# Patient Record
Sex: Male | Born: 1970 | Race: Black or African American | Hispanic: No | Marital: Married | Smoking: Never smoker
Health system: Southern US, Community
[De-identification: ages and names within clinical notes are randomized; demographics above are authoritative.]

## PROBLEM LIST (undated history)

## (undated) DIAGNOSIS — I10 Essential (primary) hypertension: Secondary | ICD-10-CM

## (undated) HISTORY — PX: HERNIA REPAIR: SHX51

---

## 1999-05-29 ENCOUNTER — Inpatient Hospital Stay (HOSPITAL_COMMUNITY): Admission: EM | Admit: 1999-05-29 | Discharge: 1999-05-31 | Payer: Self-pay | Admitting: Emergency Medicine

## 1999-05-29 ENCOUNTER — Encounter: Payer: Self-pay | Admitting: Emergency Medicine

## 2004-04-28 ENCOUNTER — Emergency Department (HOSPITAL_COMMUNITY): Admission: EM | Admit: 2004-04-28 | Discharge: 2004-04-28 | Payer: Self-pay | Admitting: Emergency Medicine

## 2004-10-01 ENCOUNTER — Emergency Department (HOSPITAL_COMMUNITY): Admission: EM | Admit: 2004-10-01 | Discharge: 2004-10-01 | Payer: Self-pay | Admitting: Emergency Medicine

## 2010-06-22 ENCOUNTER — Emergency Department (HOSPITAL_BASED_OUTPATIENT_CLINIC_OR_DEPARTMENT_OTHER)
Admission: EM | Admit: 2010-06-22 | Discharge: 2010-06-22 | Disposition: A | Discharge status: Home / Self Care | Payer: Self-pay | Attending: Emergency Medicine | Admitting: Emergency Medicine

## 2010-06-22 DIAGNOSIS — J329 Chronic sinusitis, unspecified: Secondary | ICD-10-CM | POA: Insufficient documentation

## 2010-06-22 DIAGNOSIS — I10 Essential (primary) hypertension: Secondary | ICD-10-CM | POA: Insufficient documentation

## 2010-06-22 DIAGNOSIS — R51 Headache: Secondary | ICD-10-CM | POA: Insufficient documentation

## 2011-02-17 ENCOUNTER — Emergency Department (HOSPITAL_COMMUNITY)
Admission: EM | Admit: 2011-02-17 | Discharge: 2011-02-17 | Disposition: A | Discharge status: Home / Self Care | Payer: 59 | Attending: Emergency Medicine | Admitting: Emergency Medicine

## 2011-02-17 ENCOUNTER — Encounter: Payer: Self-pay | Admitting: *Deleted

## 2011-02-17 ENCOUNTER — Emergency Department (HOSPITAL_COMMUNITY): Payer: 59

## 2011-02-17 DIAGNOSIS — R51 Headache: Secondary | ICD-10-CM | POA: Insufficient documentation

## 2011-02-17 DIAGNOSIS — R209 Unspecified disturbances of skin sensation: Secondary | ICD-10-CM | POA: Insufficient documentation

## 2011-02-17 DIAGNOSIS — I10 Essential (primary) hypertension: Secondary | ICD-10-CM

## 2011-02-17 HISTORY — DX: Essential (primary) hypertension: I10

## 2011-02-17 LAB — POCT I-STAT, CHEM 8
BUN: 9 mg/dL (ref 6–23)
Calcium, Ion: 1.24 mmol/L (ref 1.12–1.32)
Chloride: 104 mEq/L (ref 96–112)
Creatinine, Ser: 1.2 mg/dL (ref 0.50–1.35)
Glucose, Bld: 103 mg/dL — ABNORMAL HIGH (ref 70–99)
HCT: 51 % (ref 39.0–52.0)
Hemoglobin: 17.3 g/dL — ABNORMAL HIGH (ref 13.0–17.0)
Potassium: 4.2 mEq/L (ref 3.5–5.1)
Sodium: 143 mEq/L (ref 135–145)
TCO2: 31 mmol/L (ref 0–100)

## 2011-02-17 LAB — CBC
HCT: 46.6 % (ref 39.0–52.0)
Hemoglobin: 15.3 g/dL (ref 13.0–17.0)
MCH: 25.1 pg — ABNORMAL LOW (ref 26.0–34.0)
MCHC: 32.8 g/dL (ref 30.0–36.0)
MCV: 76.5 fL — ABNORMAL LOW (ref 78.0–100.0)
Platelets: 202 10*3/uL (ref 150–400)
RBC: 6.09 MIL/uL — ABNORMAL HIGH (ref 4.22–5.81)
RDW: 14.8 % (ref 11.5–15.5)
WBC: 7.4 10*3/uL (ref 4.0–10.5)

## 2011-02-17 MED ORDER — HYDROCHLOROTHIAZIDE 25 MG PO TABS: 25.0000 mg | ORAL_TABLET | Freq: Every day | ORAL | Status: AC

## 2011-02-17 NOTE — ED Notes (Signed)
Patient denies pain and is resting comfortably.  

## 2011-02-17 NOTE — ED Provider Notes (Signed)
Medical screening examination/treatment/procedure(s) were performed by non-physician practitioner and as supervising physician I was immediately available for consultation/collaboration.  Ethelda Chick, MD 02/17/11 [phone]

## 2011-02-17 NOTE — ED Provider Notes (Signed)
History     CSN: [id number]  Arrival date & time 02/17/11  0028   First MD Initiated Contact with Patient 02/17/11 [phone]      Chief Complaint  Patient presents with  . Headache    (Consider location/radiation/quality/duration/timing/severity/associated sxs/prior treatment) HPI Comments: HA x 1 week; high blood pressure x 6-8 mo, not controlled.  HA worse when lying flat.  Denies vision change, double vision, blurred vision, chest pain ,SOB, DOE, or syncope.   No hx of blood clots, high cholesterol, CAD, DM, and not currently taking any medications.   No PCP- requests resource guide at dc for follow up   Patient is a 40 y.o. male presenting with headaches. The history is provided by the patient.  Headache  The current episode started more than 1 week ago. Episode frequency: at night  The problem has not changed since onset.The headache is associated with nothing. The pain is located in the bilateral region. Quality: pressure. The pain is at a severity of 5/10. The pain is mild. The pain does not radiate. Pertinent negatives include no anorexia, no fever, no malaise/fatigue, no chest pressure, no near-syncope, no orthopnea, no palpitations, no syncope, no shortness of breath, no nausea and no vomiting. He has tried NSAIDs for the symptoms. The treatment provided no relief.    Past Medical History  Diagnosis Date  . Hypertension     History reviewed. No pertinent past surgical history.  History reviewed. No pertinent family history.  History  Substance Use Topics  . Smoking status: Not on file  . Smokeless tobacco: Not on file  . Alcohol Use:       Review of Systems  Constitutional: Negative for fever and malaise/fatigue.  HENT: Negative for ear pain, congestion, facial swelling, rhinorrhea, neck pain and neck stiffness.   Eyes: Negative for photophobia, pain and visual disturbance.  Respiratory: Negative for cough, chest tightness, shortness of breath and stridor.     Cardiovascular: Negative for chest pain, palpitations, orthopnea, leg swelling, syncope and near-syncope.  Gastrointestinal: Negative for nausea, vomiting, constipation and anorexia.  Genitourinary: Negative for difficulty urinating.  Musculoskeletal: Negative for back pain and gait problem.  Skin: Negative for rash.  Neurological: Positive for headaches. Negative for dizziness, seizures, facial asymmetry, speech difficulty, weakness, light-headedness and numbness.  Psychiatric/Behavioral: Negative for confusion.  All other systems reviewed and are negative.    Allergies  Review of patient's allergies indicates no known allergies.  Home Medications  No current outpatient prescriptions on file.  BP 158/109  Pulse 89  Temp(Src) 98.4 F (36.9 C) (Oral)  Resp 17  SpO2 98%  Physical Exam  Nursing note and vitals reviewed. Constitutional: He is oriented to person, place, and time. He appears well-developed and well-nourished. No distress.  HENT:  Head: Normocephalic and atraumatic.  Eyes: Conjunctivae and EOM are normal. Pupils are equal, round, and reactive to light. Right conjunctiva has no hemorrhage. Left conjunctiva has no hemorrhage. Right eye exhibits normal extraocular motion and no nystagmus. Left eye exhibits normal extraocular motion and no nystagmus.  Fundoscopic exam:      The right eye shows no papilledema.       The left eye shows no papilledema.       Normal appearance  Neck: Normal range of motion.  Cardiovascular: Normal rate, regular rhythm, normal heart sounds and intact distal pulses.   Pulmonary/Chest: Effort normal and breath sounds normal. No respiratory distress. He has no wheezes. He has no rales. He exhibits no tenderness.  Abdominal: Soft. There is no tenderness.  Musculoskeletal: Normal range of motion.  Neurological: He is alert and oriented to person, place, and time. He has normal strength and normal reflexes. He displays no tremor. No cranial nerve  deficit or sensory deficit (Positive past pointing: unable to sense right thigh, pt believes toughing left thigh instead). He displays a negative Romberg sign. Coordination and gait normal. GCS eye subscore is 4. GCS verbal subscore is 5. GCS motor subscore is 6.       5/5 and equal upper and lower extremity strength.  No pronator drift.  No nystagmus. Finger to nose & shin to heal intact. Able to do rapid alternating movements.  Skin: Skin is warm and dry. No rash noted.  Psychiatric: He has a normal mood and affect. His speech is normal and behavior is normal.    ED Course  Procedures (including critical care time)  Labs Reviewed  CBC - Abnormal; Notable for the following:    RBC 6.09 (*)    MCV 76.5 (*)    MCH 25.1 (*)    All other components within normal limits  POCT I-STAT, CHEM 8 - Abnormal; Notable for the following:    Glucose, Bld 103 (*)    Hemoglobin 17.3 (*)    All other components within normal limits  I-STAT, CHEM 8   Ct Head Wo Contrast  02/17/2011  *RADIOLOGY REPORT*  Clinical Data: Headache.  CT HEAD WITHOUT CONTRAST  Technique:  Contiguous axial images were obtained from the base of the skull through the vertex without contrast.  Comparison: None  Findings: No acute intracranial abnormality.  Specifically, no hemorrhage, hydrocephalus, mass lesion, acute infarction, or significant intracranial injury.  No acute calvarial abnormality. Visualized paranasal sinuses and mastoids clear.  Orbital soft tissues unremarkable.  IMPRESSION: Normal study  Original Report Authenticated By: Cyndie Chime, M.D.   Discuss possibility that patient's hypertension has caused him to have a new onset of headache.  CT results indicate no mass shift or hydrocephalus that could be causing increased cranial pressure.  Neurological physical exam showed no deficits and patient is sitting comfortably in the emergency department with no complaints of current headache.  He is in no acute distress  vital signs are stable and patient was discharged with hydrochlorothiazide 25 mg and recommendations to followup with a primary care doctor.  The patient has been discussed with Dr. Karma Ganja who agrees with my plan to discharge patient.  No diagnosis found.  Resource guide   MDM  Hypertension HA        [place], [place] 02/17/11 [phone]

## 2011-02-17 NOTE — ED Notes (Signed)
Pt states that he has had a headache x 1 week that concentrates mainly at the top of his head and tends to occur most when he is preparing to go to bed.  The pain is described as tightening and is rated @ 8/10.  Pt denies hx of same.

## 2011-02-17 NOTE — ED Notes (Signed)
Vital signs stable. 

## 2011-02-17 NOTE — ED Notes (Signed)
Patient is resting comfortably. 

## 2013-01-11 ENCOUNTER — Emergency Department (INDEPENDENT_AMBULATORY_CARE_PROVIDER_SITE_OTHER)
Admission: EM | Admit: 2013-01-11 | Discharge: 2013-01-11 | Disposition: A | Discharge status: Home / Self Care | Payer: PRIVATE HEALTH INSURANCE | Source: Home / Self Care | Attending: Emergency Medicine | Admitting: Emergency Medicine

## 2013-01-11 ENCOUNTER — Encounter (HOSPITAL_COMMUNITY): Payer: Self-pay | Admitting: Emergency Medicine

## 2013-01-11 DIAGNOSIS — L02212 Cutaneous abscess of back [any part, except buttock]: Secondary | ICD-10-CM

## 2013-01-11 DIAGNOSIS — L02219 Cutaneous abscess of trunk, unspecified: Secondary | ICD-10-CM

## 2013-01-11 MED ORDER — SULFAMETHOXAZOLE-TMP DS 800-160 MG PO TABS: 2.0000 | ORAL_TABLET | Freq: Two times a day (BID) | ORAL | Status: DC

## 2013-01-11 MED ORDER — HYDROCODONE-ACETAMINOPHEN 5-325 MG PO TABS: ORAL_TABLET | ORAL | Status: DC

## 2013-01-11 MED ORDER — AMLODIPINE BESYLATE 5 MG PO TABS: 5.0000 mg | ORAL_TABLET | Freq: Every day | ORAL | Status: DC

## 2013-01-11 NOTE — ED Notes (Signed)
C/o abscess in center of upper back on Sunday.  Puts a bandaid on everyday and pulls it off to make it drain.  Started draining Sunday.

## 2013-01-11 NOTE — ED Provider Notes (Signed)
Chief Complaint:   Chief Complaint  Patient presents with  . Abscess    History of Present Illness:    Zachary Gibbs is a 42 year old male who has had a three-day history of a painful boil on his upper, mid back and this is been draining some blood and pus. He denies any fever or chills. He denies any prior history of abscesses, pulse, or skin infections. He did not think he had any kind of cyst in this area.  Review of Systems:  Other than noted above, the patient denies any of the following symptoms: Systemic:  No fever, chills or sweats. Skin:  No rash or itching.  PMFSH:  Past medical history, family history, social history, meds, and allergies were reviewed.  No history of diabetes or prior history of abscesses or MRSA. He is supposed to be on high blood pressure medication but is not taking it right now. He tried hydrochlorothiazide, but it caused photosensitivity.  Physical Exam:   Vital signs:  BP 164/105  Pulse 90  Temp(Src) 98.1 F (36.7 C) (Oral)  Resp 16  SpO2 100% Skin:  There is a 2.5 x 2 point 0.5 cm tender, raised, red, tender abscess in the upper, mid back which is draining copious yellow pus.  Skin exam was otherwise normal.  No rash. Ext:  Distal pulses were full, patient has full ROM of all joints.  Procedure:  Verbal informed consent was obtained.  The patient was informed of the risks and benefits of the procedure and understands and accepts.  Identity of the patient was verified verbally and by wristband.   The abscess area described above was prepped with Betadine and alcohol and anesthetized with 3 mL of 2% Xylocaine with epinephrine.  Using a #11 scalpel blade, a singe straight incision was made into the area of fluctulence, yielding a large amount of prurulent drainage and squamous debris.  Routine cultures were obtained.  Blunt dissection was used to break up loculations and the resulting wound cavity was packed with 1/4 inch Iodoform gauze.  A sterile  pressure dressing was applied.  Assessment:  The encounter diagnosis was Abscess of back.  Probably due to infected sebaceous cyst. He needs to get back on a blood pressure medication, but hydrochlorothiazide appears to have caused a photosensitivity reaction. Will treat with amlodipine instead, and he was urged to get in to see a primary care physician as soon as possible.  Plan:   1.  Meds:  The following meds were prescribed:   Discharge Medication List as of 01/11/2013  8:19 PM    START taking these medications   Details  amLODipine (NORVASC) 5 MG tablet Take 1 tablet (5 mg total) by mouth daily., Starting 01/11/2013, Until Discontinued, Normal    HYDROcodone-acetaminophen (NORCO/VICODIN) 5-325 MG per tablet 1 to 2 tabs every 4 to 6 hours as needed for pain., Print    sulfamethoxazole-trimethoprim (BACTRIM DS) 800-160 MG per tablet Take 2 tablets by mouth 2 (two) times daily., Starting 01/11/2013, Until Discontinued, Normal        2.  Patient Education/Counseling:  The patient was given appropriate handouts, self care instructions, and instructed in symptomatic relief.   3.  Follow up:  The patient was instructed to leave the dressing in place and return here again in 48 hours for packing removal, if becoming worse in any way, and given some red flag symptoms such as fever which would prompt immediate return.      Reuben Likes,  MD 01/11/13 2042

## 2013-01-13 ENCOUNTER — Emergency Department (INDEPENDENT_AMBULATORY_CARE_PROVIDER_SITE_OTHER)
Admission: EM | Admit: 2013-01-13 | Discharge: 2013-01-13 | Disposition: A | Discharge status: Home / Self Care | Payer: PRIVATE HEALTH INSURANCE | Source: Home / Self Care

## 2013-01-13 ENCOUNTER — Encounter (HOSPITAL_COMMUNITY): Payer: Self-pay | Admitting: Emergency Medicine

## 2013-01-13 DIAGNOSIS — I10 Essential (primary) hypertension: Secondary | ICD-10-CM

## 2013-01-13 DIAGNOSIS — Z5189 Encounter for other specified aftercare: Secondary | ICD-10-CM

## 2013-01-13 DIAGNOSIS — L720 Epidermal cyst: Secondary | ICD-10-CM

## 2013-01-13 DIAGNOSIS — Z4800 Encounter for change or removal of nonsurgical wound dressing: Secondary | ICD-10-CM

## 2013-01-13 NOTE — ED Notes (Signed)
Here for recheck of skin infection

## 2013-01-13 NOTE — ED Notes (Signed)
Dry sterile dressing applied to wound, pt instructed regarding changing

## 2013-01-13 NOTE — ED Provider Notes (Signed)
CSN: [id number]     Arrival date & time 01/13/13  4098 History   First MD Initiated Contact with Patient 01/13/13 0940     Chief Complaint  Patient presents with  . Wound Check   (Consider location/radiation/quality/duration/timing/severity/associated sxs/prior Treatment) HPI Comments: For wound check S/P I and D of infected cyst on mid upper back. St feels better and no pain.   Past Medical History  Diagnosis Date  . Hypertension    History reviewed. No pertinent past surgical history. History reviewed. No pertinent family history. History  Substance Use Topics  . Smoking status: Never Smoker   . Smokeless tobacco: Not on file  . Alcohol Use: No    Review of Systems  All other systems reviewed and are negative.    Allergies  Review of patient's allergies indicates no known allergies.  Home Medications   Current Outpatient Rx  Name  Route  Sig  Dispense  Refill  . amLODipine (NORVASC) 5 MG tablet   Oral   Take 1 tablet (5 mg total) by mouth daily.   30 tablet   2   . EXPIRED: hydrochlorothiazide (HYDRODIURIL) 25 MG tablet   Oral   Take 1 tablet (25 mg total) by mouth daily.   30 tablet   0   . HYDROcodone-acetaminophen (NORCO/VICODIN) 5-325 MG per tablet      1 to 2 tabs every 4 to 6 hours as needed for pain.   20 tablet   0   . sulfamethoxazole-trimethoprim (BACTRIM DS) 800-160 MG per tablet   Oral   Take 2 tablets by mouth 2 (two) times daily.   40 tablet   0    BP 182/111  Pulse 106  Temp(Src) 98.8 F (37.1 C) (Oral)  SpO2 98% Physical Exam  Constitutional: He is oriented to person, place, and time.  Neurological: He is alert and oriented to person, place, and time. He exhibits normal muscle tone.  Skin: Skin is warm and dry.  Packing removed and the wound was manually compressed producing an expression of approx 20 cc of thick, cheezy exudate. No purulent material seen. No additional erythema.     ED Course  Wound repair Date/Time:  01/13/2013 10:06 AM Performed by: Phineas Real, Shanti Eichel Authorized by: Clementeen Graham, S Consent: Verbal consent obtained. Risks and benefits: risks, benefits and alternatives were discussed Consent given by: patient Patient understanding: patient states understanding of the procedure being performed Patient identity confirmed: verbally with patient Local anesthesia used: no Patient sedated: no Patient tolerance: Patient tolerated the procedure well with no immediate complications. Comments: Packing removed , contents expressed and dressing applied.    (including critical care time) Labs Review Labs Reviewed - No data to display Imaging Review No results found.    MDM   1. Encounter for wound re-check   2. Inclusion cyst   3. HTN (hypertension)      May shower but must express the water from the wound each time as directed. Keep covered for next few days until it starts to close and no further bleeding or draining. Cont the ABX.     Hayden Rasmussen, NP 01/13/13 1011

## 2013-01-15 LAB — CULTURE, ROUTINE-ABSCESS: Special Requests: NORMAL

## 2013-01-15 NOTE — ED Provider Notes (Signed)
Medical screening examination/treatment/procedure(s) were performed by a resident physician or non-physician practitioner and as the supervising physician I was immediately available for consultation/collaboration.  Raymar Joiner, MD    Bethanee Redondo S Monterius Rolf, MD 01/15/13 0842 

## 2016-03-26 ENCOUNTER — Encounter (HOSPITAL_BASED_OUTPATIENT_CLINIC_OR_DEPARTMENT_OTHER): Payer: Self-pay | Admitting: *Deleted

## 2016-03-26 ENCOUNTER — Emergency Department (HOSPITAL_BASED_OUTPATIENT_CLINIC_OR_DEPARTMENT_OTHER): Payer: 59

## 2016-03-26 ENCOUNTER — Emergency Department (HOSPITAL_BASED_OUTPATIENT_CLINIC_OR_DEPARTMENT_OTHER)
Admission: EM | Admit: 2016-03-26 | Discharge: 2016-03-26 | Disposition: A | Discharge status: Home / Self Care | Payer: 59 | Attending: Emergency Medicine | Admitting: Emergency Medicine

## 2016-03-26 DIAGNOSIS — Z79899 Other long term (current) drug therapy: Secondary | ICD-10-CM | POA: Insufficient documentation

## 2016-03-26 DIAGNOSIS — M19011 Primary osteoarthritis, right shoulder: Secondary | ICD-10-CM | POA: Diagnosis not present

## 2016-03-26 DIAGNOSIS — I10 Essential (primary) hypertension: Secondary | ICD-10-CM | POA: Insufficient documentation

## 2016-03-26 DIAGNOSIS — M25511 Pain in right shoulder: Secondary | ICD-10-CM

## 2016-03-26 DIAGNOSIS — Z7982 Long term (current) use of aspirin: Secondary | ICD-10-CM | POA: Insufficient documentation

## 2016-03-26 MED ORDER — ACETAMINOPHEN 500 MG PO TABS
1000.0000 mg | ORAL_TABLET | Freq: Once | ORAL | Status: AC
  Administered 2016-03-26: 1000 mg via ORAL
  Filled 2016-03-26: qty 2

## 2016-03-26 MED ORDER — METHOCARBAMOL 500 MG PO TABS: 1000.0000 mg | ORAL_TABLET | Freq: Three times a day (TID) | ORAL | 0 refills | Status: DC | PRN

## 2016-03-26 MED ORDER — IBUPROFEN 400 MG PO TABS
400.0000 mg | ORAL_TABLET | Freq: Once | ORAL | Status: AC
  Administered 2016-03-26: 400 mg via ORAL
  Filled 2016-03-26: qty 1

## 2016-03-26 MED FILL — METHOCARBAMOL 500 MG TABLET: 500 | 3 days supply | Qty: 20 | Fill #0

## 2016-03-26 NOTE — ED Triage Notes (Signed)
Reports right shoulder pain since Sat. Denies specific injury. Using ibuprofen and biofreeze with minimal relief.

## 2016-03-26 NOTE — ED Provider Notes (Addendum)
[facility]   CSN: [id number] Arrival date & time: 03/26/16  4098     History   Chief Complaint Chief Complaint  Patient presents with  . Shoulder Pain    HPI Zachary Gibbs is a 46 y.o. male.  Patient c/o right shoulder pain for the past few days. States work involves some lifting of boxes, but denies specific injury or strain. Pain is constant, dull, moderate. Worse w movement of shoulder. No hx chronic shoulder pain. Is right hand dom. No radicular pain. No numbness/weakness. No neck pain or stiffness. No fevers.    The history is provided by the patient.  Shoulder Pain   Pertinent negatives include no numbness.    Past Medical History:  Diagnosis Date  . Hypertension     There are no active problems to display for this patient.   Past Surgical History:  Procedure Laterality Date  . HERNIA REPAIR         Home Medications    Prior to Admission medications   Medication Sig Start Date End Date Taking? Authorizing Provider  amLODipine (NORVASC) 5 MG tablet Take 1 tablet (5 mg total) by mouth daily. 01/11/13  Yes Reuben Likes, MD  aspirin 81 MG chewable tablet Chew by mouth daily.   Yes Historical Provider, MD  hydrochlorothiazide (HYDRODIURIL) 25 MG tablet Take 1 tablet (25 mg total) by mouth daily. 02/17/11 02/17/12  Lisette Paz, PA-C  HYDROcodone-acetaminophen (NORCO/VICODIN) 5-325 MG per tablet 1 to 2 tabs every 4 to 6 hours as needed for pain. 01/11/13   Reuben Likes, MD  sulfamethoxazole-trimethoprim (BACTRIM DS) 800-160 MG per tablet Take 2 tablets by mouth 2 (two) times daily. 01/11/13   Reuben Likes, MD    Family History No family history on file.  Social History Social History  Substance Use Topics  . Smoking status: Never Smoker  . Smokeless tobacco: Never Used  . Alcohol use No     Allergies   Patient has no known allergies.   Review of Systems Review of Systems  Constitutional: Negative for fever.    Musculoskeletal: Negative for neck pain.  Skin: Negative for rash.  Neurological: Negative for weakness and numbness.     Physical Exam Updated Vital Signs BP (!) 178/113 (BP Location: Left Arm)   Pulse 93   Temp 97.7 F (36.5 C) (Oral)   Resp 18   Ht 5\' 11"  (1.803 m)   Wt 129.7 kg   SpO2 99%   BMI 39.89 kg/m   Physical Exam  Constitutional: He appears well-developed and well-nourished. No distress.  HENT:  Head: Atraumatic.  Eyes: Conjunctivae are normal.  Neck: Normal range of motion. Neck supple. No tracheal deviation present.  Cardiovascular: Normal rate and intact distal pulses.   Pulmonary/Chest: Effort normal. No accessory muscle usage. No respiratory distress.  Abdominal: He exhibits distension.  Musculoskeletal: He exhibits no edema.  Tenderness right shoulder. No gross sts noted. No skin changes or erythema. No pain w passive rom. Pain w full extension, abduction. Radial pulse 2+. No arm swelling.   Neurological: He is alert.  RUE rad/med/uln fxn, motor 5/5, sens grossly intact.   Skin: Skin is warm and dry. No rash noted.  Psychiatric: He has a normal mood and affect.  Nursing note and vitals reviewed.    ED Treatments / Results  Labs (all labs ordered are listed, but only abnormal results are displayed) Labs Reviewed - No data to display  EKG  EKG  Interpretation None       Radiology Dg Shoulder Right  Result Date: 03/26/2016 CLINICAL DATA:  Right shoulder pain for 4 days EXAM: RIGHT SHOULDER - 2+ VIEW COMPARISON:  None. FINDINGS: Early spurring at the rotator cuff insertion on the greater tuberosity. No acute bony abnormality. Specifically, no fracture, subluxation, or dislocation. Soft tissues are intact. IMPRESSION: Early degenerative changes.  No acute bony abnormality. Electronically Signed   By: Charlett NoseKevin  Dover M.D.   On: 03/26/2016 09:13    Procedures Procedures (including critical care time)  Medications Ordered in ED Medications   ibuprofen (ADVIL,MOTRIN) tablet 400 mg (not administered)  acetaminophen (TYLENOL) tablet 1,000 mg (not administered)     Initial Impression / Assessment and Plan / ED Course  I have reviewed the triage vital signs and the nursing notes.  Pertinent labs & imaging results that were available during my care of the patient were reviewed by me and considered in my medical decision making (see chart for details).  Imaging.   Tylenol and motrin po.  Discussed diff dx incl rotator cuff strain or injury, DDD,  labrum injury, ddd, other shoulder musculoskeletal pain.  For bp, pt states has adequate meds at home, but hasnt taken yet today.  No headache. No cp. No sob. No edema.   Patient appear stable for d/c.   Will have f/u pcp re bp.   Pt requests work note.   Final Clinical Impressions(s) / ED Diagnoses   Final diagnoses:  None    New Prescriptions New Prescriptions   No medications on file        Cathren LaineKevin Wynonna Fitzhenry, MD 03/26/16 [phone]

## 2016-03-26 NOTE — Discharge Instructions (Addendum)
It was our pleasure to provide your ER care today - we hope that you feel better.  Take motrin or aleve as need for pain.  You may also try robaxin as need for muscle spasm/pain.  You may try heat and/or gentle massage of area.  Follow up with sports medicine doctor or orthopedist in 1-2 weeks if symptoms fail to improve/resolve - see referral - call office to arrange appointment.  Your blood pressure is high - continue home meds, see high blood pressure information attached, and follow up with your primary care doctor for recheck in 1 week.   Return to ER if worse, intractable pain, numbness/weakness, fevers, other concern.

## 2017-06-13 ENCOUNTER — Encounter (HOSPITAL_BASED_OUTPATIENT_CLINIC_OR_DEPARTMENT_OTHER): Payer: Self-pay | Admitting: Emergency Medicine

## 2017-06-13 ENCOUNTER — Other Ambulatory Visit: Payer: Self-pay

## 2017-06-13 ENCOUNTER — Emergency Department (HOSPITAL_BASED_OUTPATIENT_CLINIC_OR_DEPARTMENT_OTHER)
Admission: EM | Admit: 2017-06-13 | Discharge: 2017-06-13 | Disposition: A | Discharge status: Home / Self Care | Payer: 59 | Attending: Emergency Medicine | Admitting: Emergency Medicine

## 2017-06-13 DIAGNOSIS — H5711 Ocular pain, right eye: Secondary | ICD-10-CM | POA: Insufficient documentation

## 2017-06-13 DIAGNOSIS — Z79899 Other long term (current) drug therapy: Secondary | ICD-10-CM | POA: Insufficient documentation

## 2017-06-13 DIAGNOSIS — Z7982 Long term (current) use of aspirin: Secondary | ICD-10-CM | POA: Diagnosis not present

## 2017-06-13 DIAGNOSIS — I10 Essential (primary) hypertension: Secondary | ICD-10-CM | POA: Insufficient documentation

## 2017-06-13 MED ORDER — CIPROFLOXACIN HCL 0.3 % OP SOLN: 2.0000 [drp] | Freq: Four times a day (QID) | OPHTHALMIC | 0 refills | Status: AC

## 2017-06-13 MED ORDER — FLUORESCEIN SODIUM 1 MG OP STRP
ORAL_STRIP | OPHTHALMIC | Status: AC
  Administered 2017-06-13: 1 via OPHTHALMIC
  Filled 2017-06-13: qty 1

## 2017-06-13 MED ORDER — TETRACAINE HCL 0.5 % OP SOLN
2.0000 [drp] | Freq: Once | OPHTHALMIC | Status: AC
  Administered 2017-06-13: 2 [drp] via OPHTHALMIC

## 2017-06-13 MED ORDER — FLUORESCEIN SODIUM 1 MG OP STRP
1.0000 | ORAL_STRIP | Freq: Once | OPHTHALMIC | Status: AC
  Administered 2017-06-13: 1 via OPHTHALMIC

## 2017-06-13 MED ORDER — TETRACAINE HCL 0.5 % OP SOLN
OPHTHALMIC | Status: AC
  Administered 2017-06-13: 2 [drp] via OPHTHALMIC
  Filled 2017-06-13: qty 4

## 2017-06-13 NOTE — ED Triage Notes (Signed)
Eye pain when looking at lights to OD, denies recent recent injury, no drainage but sclera red

## 2017-06-13 NOTE — Discharge Instructions (Addendum)
Use ciprofloxacin drops every 6 hours.  You can take ibuprofen and Tylenol as prescribed over-the-counter, as needed for your pain.  Please follow-up with the ophthalmologist within 2 days.  Please return to the emergency department if you develop any new or worsening symptoms including vision changes, or any other worsening of symptoms.

## 2017-06-13 NOTE — ED Provider Notes (Signed)
[facility] EMERGENCY DEPARTMENT Provider Note   CSN: [id number] Arrival date & time: 06/13/17  1022     History   Chief Complaint Chief Complaint  Patient presents with  . Eye Pain    HPI Zachary Gibbs is a 47 y.o. male with history of hypertension who presents with a less than 1 day history of right eye irritation and redness.  Patient has had associated photophobia.  He reports feeling like he had something in his eye yesterday afternoon and then woke up with pain around 2 AM this morning.  At that time he noticed his eye was red.  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  He reports he did not go to work yesterday.  He did go to workshop for a couple hours, however.  He does not remember an instance where he got something in his eye.  He denies any injury to his eye.  He tried an over-the-counter allergy drop which did not seem to help.  He denies any vision changes.  He does not wear contact lenses.  He does wear readers occasionally.  HPI  Past Medical History:  Diagnosis Date  . Hypertension     There are no active problems to display for this patient.   Past Surgical History:  Procedure Laterality Date  . HERNIA REPAIR          Home Medications    Prior to Admission medications   Medication Sig Start Date End Date Taking? Authorizing Provider  amLODipine (NORVASC) 5 MG tablet Take 1 tablet (5 mg total) by mouth daily. 01/11/13  Yes Reuben LikesKeller, David C, MD  aspirin 81 MG chewable tablet Chew by mouth daily.   Yes [provider]  ciprofloxacin (CILOXAN) 0.3 % ophthalmic solution Place 2 drops into the right eye every 6 (six) hours for 5 days. 06/13/17 06/18/17  Ghazal Pevey, Waylan BogaAlexandra M, PA-C  hydrochlorothiazide (HYDRODIURIL) 25 MG tablet Take 1 tablet (25 mg total) by mouth daily. 02/17/11 02/17/12  Jaci CarrelPaz, Lisette, PA-C  HYDROcodone-acetaminophen (NORCO/VICODIN) 5-325 MG per tablet 1 to 2 tabs every 4 to 6 hours as needed for pain.  01/11/13   Reuben LikesKeller, David C, MD  methocarbamol (ROBAXIN) 500 MG tablet Take 2 tablets (1,000 mg total) by mouth 3 (three) times daily as needed for muscle spasms. 03/26/16   Cathren LaineSteinl, Kevin, MD  sulfamethoxazole-trimethoprim (BACTRIM DS) 800-160 MG per tablet Take 2 tablets by mouth 2 (two) times daily. 01/11/13   Reuben LikesKeller, David C, MD    Family History No family history on file.  Social History Social History   Tobacco Use  . Smoking status: Never Smoker  . Smokeless tobacco: Never Used  Substance Use Topics  . Alcohol use: No  . Drug use: No     Allergies   Patient has no known allergies.   Review of Systems Review of Systems  Constitutional: Negative for chills and fever.  HENT: Negative for facial swelling and sore throat.   Eyes: Positive for photophobia, pain and redness. Negative for visual disturbance.  Respiratory: Negative for shortness of breath.   Cardiovascular: Negative for chest pain.  Gastrointestinal: Negative for abdominal pain, nausea and vomiting.  Genitourinary: Negative for dysuria.  Musculoskeletal: Negative for back pain.  Skin: Negative for rash and wound.  Neurological: Negative for headaches.  Psychiatric/Behavioral: The patient is not nervous/anxious.      Physical Exam Updated Vital Signs BP (!) 166/109 (BP Location: Left Arm)   Pulse  85   Temp 98 F (36.7 C) (Oral)   Resp 16   Ht 5\' 11"  (1.803 m)   Wt 117.9 kg (260 lb)   SpO2 98%   BMI 36.26 kg/m   Physical Exam  Constitutional: He appears well-developed and well-nourished. No distress.  HENT:  Head: Normocephalic and atraumatic.  Mouth/Throat: Oropharynx is clear and moist. No oropharyngeal exudate.  Eyes: Pupils are equal, round, and reactive to light. EOM and lids are normal. Lids are everted and swept, no foreign bodies found. Right eye exhibits discharge (watery). Left eye exhibits no discharge. Right conjunctiva is injected. No scleral icterus.  Conjunctiva is injected No  fluorescein uptake noted to the R cornea Tono-Pen pressures average 20 in the right eye Patient reports pain in one specific spot under his right eyelid superior to his cornea when he moves his eye Patient with photophobia and mild consensual photophobia in the right eye  Neck: Normal range of motion. Neck supple. No thyromegaly present.  Cardiovascular: Normal rate, regular rhythm, normal heart sounds and intact distal pulses. Exam reveals no gallop and no friction rub.  No murmur heard. Pulmonary/Chest: Effort normal and breath sounds normal. No stridor. No respiratory distress. He has no wheezes. He has no rales.  Abdominal: Soft. Bowel sounds are normal. He exhibits no distension. There is no tenderness. There is no rebound and no guarding.  Musculoskeletal: He exhibits no edema.  Lymphadenopathy:    He has no cervical adenopathy.  Neurological: He is alert. Coordination normal.  Skin: Skin is warm and dry. No rash noted. He is not diaphoretic. No pallor.  Psychiatric: He has a normal mood and affect.  Nursing note and vitals reviewed.    ED Treatments / Results  Labs (all labs ordered are listed, but only abnormal results are displayed) Labs Reviewed - No data to display  EKG None  Radiology No results found.  Procedures Procedures (including critical care time)  Medications Ordered in ED Medications  tetracaine (PONTOCAINE) 0.5 % ophthalmic solution 2 drop (has no administration in time range)  fluorescein ophthalmic strip 1 strip (has no administration in time range)     Initial Impression / Assessment and Plan / ED Course  I have reviewed the triage vital signs and the nursing notes.  Pertinent labs & imaging results that were available during my care of the patient were reviewed by me and considered in my medical decision making (see chart for details).     Patient with conjunctivitis potentially from an irritant or foreign body. No signs of corneal abrasion.  No evidence of FB now.  Exam not concerning for orbital cellulitis, hyphema.  Low suspicion for scleritis.  Patient will be discharged home with ciprofloxacin drops.   Patient understands to follow up with ophthalmology in 24-48 hours; return precautions discussed.  Patient understands and agrees with plan.  Patient vitals stable and discharged in satisfactory condition. I discussed patient case with Dr. Eudelia Bunch who guided the patient's management and agrees with plan.    Final Clinical Impressions(s) / ED Diagnoses   Final diagnoses:  Pain of right eye    ED Discharge Orders        Ordered    ciprofloxacin (CILOXAN) 0.3 % ophthalmic solution  Every 6 hours     06/13/17 1219       Emi Holes, PA-C 06/13/17 1223    Nira Conn, MD 06/13/17 1758

## 2017-11-30 ENCOUNTER — Emergency Department (HOSPITAL_COMMUNITY)
Admission: EM | Admit: 2017-11-30 | Discharge: 2017-11-30 | Disposition: A | Discharge status: Home / Self Care | Payer: 59 | Source: Home / Self Care | Attending: Emergency Medicine | Admitting: Emergency Medicine

## 2017-11-30 ENCOUNTER — Encounter (HOSPITAL_COMMUNITY): Payer: Self-pay | Admitting: Emergency Medicine

## 2017-11-30 ENCOUNTER — Emergency Department (HOSPITAL_COMMUNITY): Payer: 59

## 2017-11-30 ENCOUNTER — Other Ambulatory Visit: Payer: Self-pay

## 2017-11-30 ENCOUNTER — Encounter (HOSPITAL_COMMUNITY): Payer: Self-pay | Admitting: *Deleted

## 2017-11-30 ENCOUNTER — Emergency Department (HOSPITAL_COMMUNITY)
Admission: EM | Admit: 2017-11-30 | Discharge: 2017-11-30 | Disposition: A | Discharge status: Home / Self Care | Payer: 59 | Attending: Emergency Medicine | Admitting: Emergency Medicine

## 2017-11-30 DIAGNOSIS — R030 Elevated blood-pressure reading, without diagnosis of hypertension: Secondary | ICD-10-CM

## 2017-11-30 DIAGNOSIS — Z79899 Other long term (current) drug therapy: Secondary | ICD-10-CM

## 2017-11-30 DIAGNOSIS — I10 Essential (primary) hypertension: Secondary | ICD-10-CM | POA: Insufficient documentation

## 2017-11-30 DIAGNOSIS — Z7982 Long term (current) use of aspirin: Secondary | ICD-10-CM | POA: Insufficient documentation

## 2017-11-30 DIAGNOSIS — R112 Nausea with vomiting, unspecified: Secondary | ICD-10-CM

## 2017-11-30 DIAGNOSIS — R110 Nausea: Secondary | ICD-10-CM

## 2017-11-30 DIAGNOSIS — R51 Headache: Secondary | ICD-10-CM | POA: Diagnosis present

## 2017-11-30 DIAGNOSIS — R519 Headache, unspecified: Secondary | ICD-10-CM

## 2017-11-30 LAB — CBC
HEMATOCRIT: 46.5 % (ref 39.0–52.0)
HEMOGLOBIN: 15.9 g/dL (ref 13.0–17.0)
MCH: 26.9 pg (ref 26.0–34.0)
MCHC: 34.2 g/dL (ref 30.0–36.0)
MCV: 78.5 fL (ref 78.0–100.0)
Platelets: 178 10*3/uL (ref 150–400)
RBC: 5.92 MIL/uL — AB (ref 4.22–5.81)
RDW: 14 % (ref 11.5–15.5)
WBC: 8 10*3/uL (ref 4.0–10.5)

## 2017-11-30 LAB — BASIC METABOLIC PANEL
ANION GAP: 10 (ref 5–15)
BUN: 14 mg/dL (ref 6–20)
CALCIUM: 10.1 mg/dL (ref 8.9–10.3)
CHLORIDE: 103 mmol/L (ref 98–111)
CO2: 30 mmol/L (ref 22–32)
Creatinine, Ser: 1.19 mg/dL (ref 0.61–1.24)
GFR calc non Af Amer: >60 mL/min (ref >60)
Glucose, Bld: 96 mg/dL (ref 70–99)
POTASSIUM: 3.8 mmol/L (ref 3.5–5.1)
Sodium: 143 mmol/L (ref 135–145)

## 2017-11-30 LAB — I-STAT TROPONIN, ED: Troponin i, poc: 0.01 ng/mL (ref 0.00–0.08)

## 2017-11-30 MED ORDER — AMLODIPINE BESYLATE 5 MG PO TABS
5.0000 mg | ORAL_TABLET | Freq: Once | ORAL | Status: AC
  Administered 2017-11-30: 5 mg via ORAL
  Filled 2017-11-30: qty 1

## 2017-11-30 MED ORDER — SODIUM CHLORIDE 0.9 % IV BOLUS
500.0000 mL | Freq: Once | INTRAVENOUS | Status: AC
  Administered 2017-11-30: 500 mL via INTRAVENOUS

## 2017-11-30 MED ORDER — HYDRALAZINE HCL 20 MG/ML IJ SOLN
10.0000 mg | Freq: Once | INTRAMUSCULAR | Status: AC
  Administered 2017-11-30: 10 mg via INTRAVENOUS
  Filled 2017-11-30: qty 1

## 2017-11-30 MED ORDER — ONDANSETRON 4 MG PO TBDP: 4.0000 mg | ORAL_TABLET | Freq: Three times a day (TID) | ORAL | 0 refills | Status: DC | PRN

## 2017-11-30 MED ORDER — AMLODIPINE BESYLATE 5 MG PO TABS: 5.0000 mg | ORAL_TABLET | Freq: Every day | ORAL | 2 refills | Status: AC

## 2017-11-30 MED ORDER — METOCLOPRAMIDE HCL 5 MG/ML IJ SOLN
10.0000 mg | Freq: Once | INTRAMUSCULAR | Status: AC
  Administered 2017-11-30: 10 mg via INTRAVENOUS
  Filled 2017-11-30: qty 2

## 2017-11-30 MED ORDER — HYDROCHLOROTHIAZIDE 12.5 MG PO CAPS
25.0000 mg | ORAL_CAPSULE | Freq: Once | ORAL | Status: AC
  Administered 2017-11-30: 25 mg via ORAL
  Filled 2017-11-30: qty 2

## 2017-11-30 MED ORDER — ACETAMINOPHEN 500 MG PO TABS
1000.0000 mg | ORAL_TABLET | Freq: Once | ORAL | Status: AC
  Administered 2017-11-30: 1000 mg via ORAL
  Filled 2017-11-30: qty 2

## 2017-11-30 MED ORDER — DIPHENHYDRAMINE HCL 50 MG/ML IJ SOLN
25.0000 mg | Freq: Once | INTRAMUSCULAR | Status: AC
  Administered 2017-11-30: 25 mg via INTRAVENOUS
  Filled 2017-11-30: qty 1

## 2017-11-30 NOTE — ED Triage Notes (Addendum)
Pt reports onset of vomiting at 1630 today ("it is more dry heaves and mucous") and still has a headache. Denies any other pain, just says he feels jittery. Pt was seen here earlier, d/c around 1600 for hypertension. He reports he has not taken any further medication since being discharged.

## 2017-11-30 NOTE — ED Provider Notes (Signed)
[facility]-EMERGENCY DEPT Provider Note   CSN: [id number] Arrival date & time: 11/30/17  1327     History   Chief Complaint Chief Complaint  Patient presents with  . Headache  . hyertension    HPI Zachary Gibbs is a 47 y.o. male.  HPI  Patient presents the emergency department complaints of headache over the past 3 to 4 days.  Yesterday he had some nausea and vomiting.  No nausea or vomiting today.  Headache persists but is not as severe.  Seen at the urgent care and sent to the ER for further evaluation given an elevated blood pressure.  On arrival to the emergency department his blood pressure is 196/130.  He has been off of his amlodipine for the past 3 to 4 months.  Denies unilateral arm or leg weakness.  No change in his vision.  No nausea at this time.  Symptoms are moderate in severity.   Past Medical History:  Diagnosis Date  . Hypertension     There are no active problems to display for this patient.   Past Surgical History:  Procedure Laterality Date  . HERNIA REPAIR          Home Medications    Prior to Admission medications   Medication Sig Start Date End Date Taking? Authorizing Provider  amLODipine (NORVASC) 5 MG tablet Take 1 tablet (5 mg total) by mouth daily. 01/11/13   Reuben Likes, MD  aspirin 81 MG chewable tablet Chew by mouth daily.    [provider]  hydrochlorothiazide (HYDRODIURIL) 25 MG tablet Take 1 tablet (25 mg total) by mouth daily. 02/17/11 02/17/12  Jaci Carrel, PA-C  HYDROcodone-acetaminophen (NORCO/VICODIN) 5-325 MG per tablet 1 to 2 tabs every 4 to 6 hours as needed for pain. 01/11/13   Reuben Likes, MD  methocarbamol (ROBAXIN) 500 MG tablet Take 2 tablets (1,000 mg total) by mouth 3 (three) times daily as needed for muscle spasms. 03/26/16   Cathren Laine, MD  sulfamethoxazole-trimethoprim (BACTRIM DS) 800-160 MG per tablet Take 2 tablets by mouth 2 (two) times daily. 01/11/13   Reuben Likes, MD    Family History No family history on file.  Social History Social History   Tobacco Use  . Smoking status: Never Smoker  . Smokeless tobacco: Never Used  Substance Use Topics  . Alcohol use: No  . Drug use: No     Allergies   Patient has no known allergies.   Review of Systems Review of Systems  All other systems reviewed and are negative.    Physical Exam Updated Vital Signs BP (!) 190/119   Pulse 88   Temp 98.5 F (36.9 C) (Oral)   Resp 18   SpO2 98%   Physical Exam  Constitutional: He is oriented to person, place, and time. He appears well-developed and well-nourished.  HENT:  Head: Normocephalic and atraumatic.  Eyes: Pupils are equal, round, and reactive to light. EOM are normal.  Neck: Normal range of motion.  Cardiovascular: Normal rate, regular rhythm, normal heart sounds and intact distal pulses.  Pulmonary/Chest: Effort normal and breath sounds normal. No respiratory distress.  Abdominal: Soft. He exhibits no distension. There is no tenderness.  Musculoskeletal: Normal range of motion.  Neurological: He is alert and oriented to person, place, and time.  5/5 strength in major muscle groups of  bilateral upper and lower extremities. Speech normal. No facial asymetry.   Skin: Skin is warm and dry.  Psychiatric:  He has a normal mood and affect. Judgment normal.  Nursing note and vitals reviewed.    ED Treatments / Results  Labs (all labs ordered are listed, but only abnormal results are displayed) Labs Reviewed  CBC - Abnormal; Notable for the following components:      Result Value   RBC 5.92 (*)    All other components within normal limits  BASIC METABOLIC PANEL    EKG None  Radiology Ct Head Wo Contrast  Result Date: 11/30/2017 CLINICAL DATA:  Headache and dizziness with hypertension EXAM: CT HEAD WITHOUT CONTRAST TECHNIQUE: Contiguous axial images were obtained from the base of the skull through the vertex without  intravenous contrast. COMPARISON:  February 17, 2011 FINDINGS: Brain: The ventricles are normal in size and configuration. There is no intracranial mass, hemorrhage, extra-axial fluid collection, or midline shift. Brain parenchyma appears unremarkable. No acute infarct evident. Vascular: No hyperdense vessel evident. There is calcification in each carotid siphon region. Skull: The bony calvarium appears intact. Sinuses/Orbits: There is mucosal thickening in several ethmoid air cells. Other visualized paranasal sinuses are clear. Orbits appear symmetric bilaterally. Other: Mastoid air cells are clear. IMPRESSION: No acute infarct evident.  No mass or hemorrhage. There are foci of arterial vascular calcification. There is mucosal thickening in several ethmoid air cells. Electronically Signed   By: Bretta Bang III M.D.   On: 11/30/2017 14:51    Procedures Procedures (including critical care time)  Medications Ordered in ED Medications  hydrALAZINE (APRESOLINE) injection 10 mg (10 mg Intravenous Given 11/30/17 1441)  sodium chloride 0.9 % bolus 500 mL (500 mLs Intravenous New Bag/Given 11/30/17 1441)  amLODipine (NORVASC) tablet 5 mg (5 mg Oral Given 11/30/17 1441)  hydrochlorothiazide (MICROZIDE) capsule 25 mg (25 mg Oral Given 11/30/17 1441)  acetaminophen (TYLENOL) tablet 1,000 mg (1,000 mg Oral Given 11/30/17 1504)     Initial Impression / Assessment and Plan / ED Course  I have reviewed the triage vital signs and the nursing notes.  Pertinent labs & imaging results that were available during my care of the patient were reviewed by me and considered in my medical decision making (see chart for details).     The patient is overall well-appearing.  Normal neurologic exam.  Head CT without acute pathology.  Patient will be discharged home in good condition to follow-up with his primary care physician.  Home with amlodipine.  Final Clinical Impressions(s) / ED Diagnoses   Final diagnoses:    None    ED Discharge Orders    None       Azalia Bilis, MD 11/30/17 [phone]

## 2017-11-30 NOTE — ED Notes (Signed)
Pt c/o of "slight chest discomfort and not being able to get comfortable in his bed" MD notified. Went back in from to obtain EKG and I-stat troponin and pt said he is "not having pain but just cant get comfortable." EKG and I-stat troponin completed and EKG given to Rees,MD

## 2017-11-30 NOTE — ED Provider Notes (Signed)
[facility]-EMERGENCY DEPT Provider Note   CSN: [id number] Arrival date & time: 11/30/17  1907     History   Chief Complaint Chief Complaint  Patient presents with  . Emesis    HPI Zachary Gibbs is a 47 y.o. male.  The history is provided by the patient. No language interpreter was used.  Emesis     Zachary Gibbs is a 47 y.o. male who presents to the Emergency Department complaining of HA, vomiting. He presents to the emergency department for evaluation of headache and vomiting. He has a history of hypertension with a baseline blood pressure in the 170 systolic. He has been off his medications for several months. He was seen earlier today for high blood pressure was discharged home at that time. Since hospital discharge she is developed dry heaves and worsening nausea. Yesterday when he awoke he had a generalized headache. It is been constant nature. He denies any numbness. He has generalized weakness. No dizziness, chest pain, shortness of breath. No prior similar symptoms. He had no nausea when he was evaluated in the emergency department earlier. He did receive some medications for high blood pressure at that time and was discharged home. Following hospital discharge he has experienced nausea and dry heaved and is been unable to keep anything down. Past Medical History:  Diagnosis Date  . Hypertension     There are no active problems to display for this patient.   Past Surgical History:  Procedure Laterality Date  . HERNIA REPAIR          Home Medications    Prior to Admission medications   Medication Sig Start Date End Date Taking? Authorizing Provider  amLODipine (NORVASC) 5 MG tablet Take 1 tablet (5 mg total) by mouth daily. 11/30/17  Yes Azalia Bilis, MD  aspirin 81 MG chewable tablet Chew by mouth daily.   Yes [provider]  hydrochlorothiazide (HYDRODIURIL) 25 MG tablet Take 1 tablet (25 mg total) by mouth daily.  02/17/11 11/30/17 Yes Paz, Lisette, PA-C  HYDROcodone-acetaminophen (NORCO/VICODIN) 5-325 MG per tablet 1 to 2 tabs every 4 to 6 hours as needed for pain. 01/11/13   Reuben Likes, MD  methocarbamol (ROBAXIN) 500 MG tablet Take 2 tablets (1,000 mg total) by mouth 3 (three) times daily as needed for muscle spasms. Patient not taking: Reported on 11/30/2017 03/26/16   Cathren Laine, MD  ondansetron (ZOFRAN ODT) 4 MG disintegrating tablet Take 1 tablet (4 mg total) by mouth every 8 (eight) hours as needed for nausea or vomiting. 11/30/17   Tilden Fossa, MD  sulfamethoxazole-trimethoprim (BACTRIM DS) 800-160 MG per tablet Take 2 tablets by mouth 2 (two) times daily. Patient not taking: Reported on 11/30/2017 01/11/13   Reuben Likes, MD    Family History No family history on file.  Social History Social History   Tobacco Use  . Smoking status: Never Smoker  . Smokeless tobacco: Never Used  Substance Use Topics  . Alcohol use: No  . Drug use: No     Allergies   Patient has no known allergies.   Review of Systems Review of Systems  Gastrointestinal: Positive for vomiting.  All other systems reviewed and are negative.    Physical Exam Updated Vital Signs BP (!) 175/110 (BP Location: Right Arm)   Pulse 74   Temp 98.9 F (37.2 C) (Oral)   Resp 16   SpO2 99%   Physical Exam  Constitutional: He is oriented to person, place,  and time. He appears well-developed and well-nourished.  HENT:  Head: Normocephalic and atraumatic.  Cardiovascular: Normal rate and regular rhythm.  No murmur heard. Pulmonary/Chest: Effort normal and breath sounds normal. No respiratory distress.  Abdominal: Soft. There is no tenderness. There is no rebound and no guarding.  Musculoskeletal: He exhibits no edema or tenderness.  Neurological: He is alert and oriented to person, place, and time. No cranial nerve deficit. Coordination normal.  No pronator drift. Five out of five strength in all four  extremities with sensation to light touch intact in all four extremities  Skin: Skin is warm and dry.  Psychiatric: He has a normal mood and affect. His behavior is normal.  Nursing note and vitals reviewed.    ED Treatments / Results  Labs (all labs ordered are listed, but only abnormal results are displayed) Labs Reviewed  I-STAT TROPONIN, ED    EKG EKG Interpretation  Date/Time:  Monday November 30 2017 21:44:27 EDT Ventricular Rate:  76 PR Interval:    QRS Duration: 88 QT Interval:  392 QTC Calculation: 441 R Axis:   43 Text Interpretation:  Sinus rhythm Probable left atrial enlargement Abnormal R-wave progression, early transition Nonspecific T abnormalities, lateral leads Borderline ST elevation, anterior leads Confirmed by Tilden Fossa [phone]) on 11/30/2017 9:54:07 PM   Radiology Ct Head Wo Contrast  Result Date: 11/30/2017 CLINICAL DATA:  Headache and dizziness with hypertension EXAM: CT HEAD WITHOUT CONTRAST TECHNIQUE: Contiguous axial images were obtained from the base of the skull through the vertex without intravenous contrast. COMPARISON:  February 17, 2011 FINDINGS: Brain: The ventricles are normal in size and configuration. There is no intracranial mass, hemorrhage, extra-axial fluid collection, or midline shift. Brain parenchyma appears unremarkable. No acute infarct evident. Vascular: No hyperdense vessel evident. There is calcification in each carotid siphon region. Skull: The bony calvarium appears intact. Sinuses/Orbits: There is mucosal thickening in several ethmoid air cells. Other visualized paranasal sinuses are clear. Orbits appear symmetric bilaterally. Other: Mastoid air cells are clear. IMPRESSION: No acute infarct evident.  No mass or hemorrhage. There are foci of arterial vascular calcification. There is mucosal thickening in several ethmoid air cells. Electronically Signed   By: Bretta Bang III M.D.   On: 11/30/2017 14:51     Procedures Procedures (including critical care time)  Medications Ordered in ED Medications  metoCLOPramide (REGLAN) injection 10 mg (10 mg Intravenous Given 11/30/17 2044)  diphenhydrAMINE (BENADRYL) injection 25 mg (25 mg Intravenous Given 11/30/17 2043)  sodium chloride 0.9 % bolus 500 mL (0 mLs Intravenous Stopped 11/30/17 2219)     Initial Impression / Assessment and Plan / ED Course  I have reviewed the triage vital signs and the nursing notes.  Pertinent labs & imaging results that were available during my care of the patient were reviewed by me and considered in my medical decision making (see chart for details).     Patient with history of hypertension, not on any antihypertensives here for evaluation of nausea, dry heaves and headache. Reviewed ED visit from earlier today. CT head as well as labs were unremarkable. Patient states that his nausea occurred following ED discharge and thinks this may be secondary to the medications he received. Symptoms were treated with Reglan, Benadryl. On repeat assessment he is feeling significantly improved. He does remain hypertensive but his blood pressure did improve from presentation. Presentation is not consistent with subarachnoid hemorrhage, hypertensive emergency. EKG does show abnormalities consistent with LVH, similar when compared to priors. Patient has  no chest pain in the emergency department. Discussed with patient importance of taking his medications as directed as well as close outpatient follow-up and return precautions.  Final Clinical Impressions(s) / ED Diagnoses   Final diagnoses:  Nausea  Bad headache  Essential hypertension    ED Discharge Orders         Ordered    ondansetron (ZOFRAN ODT) 4 MG disintegrating tablet  Every 8 hours PRN     11/30/17 2218           Tilden Fossa, MD 11/30/17 2302

## 2017-11-30 NOTE — ED Triage Notes (Signed)
Pt sent from UC for headache, High BP. Pt stopped taking HTN meds abut 4 months ago. Had dizziness and vomiting yesterday but denies today.

## 2019-01-10 ENCOUNTER — Other Ambulatory Visit: Payer: Self-pay

## 2019-01-10 DIAGNOSIS — Z20822 Contact with and (suspected) exposure to covid-19: Secondary | ICD-10-CM

## 2019-01-12 LAB — NOVEL CORONAVIRUS, NAA: SARS-CoV-2, NAA: NOT DETECTED

## 2019-07-25 ENCOUNTER — Encounter (HOSPITAL_COMMUNITY): Payer: Self-pay

## 2019-07-25 ENCOUNTER — Ambulatory Visit (HOSPITAL_COMMUNITY)
Admission: EM | Admit: 2019-07-25 | Discharge: 2019-07-25 | Disposition: A | Discharge status: Home / Self Care | Payer: BC Managed Care – PPO

## 2019-07-25 ENCOUNTER — Other Ambulatory Visit: Payer: Self-pay

## 2019-07-25 DIAGNOSIS — L089 Local infection of the skin and subcutaneous tissue, unspecified: Secondary | ICD-10-CM

## 2019-07-25 DIAGNOSIS — L729 Follicular cyst of the skin and subcutaneous tissue, unspecified: Secondary | ICD-10-CM

## 2019-07-25 MED ORDER — LIDOCAINE HCL 2 % IJ SOLN
INTRAMUSCULAR | Status: AC
  Filled 2019-07-25: qty 20

## 2019-07-25 MED ORDER — DOXYCYCLINE HYCLATE 100 MG PO CAPS: 100.0000 mg | ORAL_CAPSULE | Freq: Two times a day (BID) | ORAL | 0 refills | Status: AC

## 2019-07-25 NOTE — ED Triage Notes (Signed)
Pt presents to UC with an painful abscess in the middle of the back 4 days. Pt reports he was drying his back with a towel and the abscess started having a discharge.

## 2019-07-25 NOTE — ED Provider Notes (Signed)
[place]    CSN: [id number] Arrival date & time: 07/25/19  8413      History   Chief Complaint Chief Complaint  Patient presents with  . Abscess    HPI Zachary Gibbs is a 49 y.o. male.   Patient is a 49 year old male presents today with abscess to upper back area.  This has been present worsening over the past 4 days.  He is reporting minimal drainage noted.  Tender to touch.  No fevers, chills, nausea.  History of similar in the past to upper back.  Denies any history of MRSA.  ROS per HPI      Past Medical History:  Diagnosis Date  . Hypertension     There are no problems to display for this patient.   Past Surgical History:  Procedure Laterality Date  . HERNIA REPAIR         Home Medications    Prior to Admission medications   Medication Sig Start Date End Date Taking? Authorizing Provider  amLODipine (NORVASC) 5 MG tablet Take 1 tablet (5 mg total) by mouth daily. 11/30/17   Jola Schmidt, MD  aspirin 81 MG chewable tablet Chew by mouth daily.    [provider]  doxycycline (VIBRAMYCIN) 100 MG capsule Take 1 capsule (100 mg total) by mouth 2 (two) times daily for 7 days. 07/25/19 08/01/19  Loura Halt A, NP  hydrochlorothiazide (HYDRODIURIL) 25 MG tablet Take 1 tablet (25 mg total) by mouth daily. 02/17/11 11/30/17  Verl Dicker, PA-C  triamterene-hydrochlorothiazide (MAXZIDE-25) 37.5-25 MG tablet Take 1 tablet by mouth daily. 05/10/19   [provider]    Family History History reviewed. No pertinent family history.  Social History Social History   Tobacco Use  . Smoking status: Never Smoker  . Smokeless tobacco: Never Used  Substance Use Topics  . Alcohol use: No  . Drug use: No     Allergies   Patient has no known allergies.   Review of Systems Review of Systems   Physical Exam Triage Vital Signs ED Triage Vitals  Enc Vitals Group     BP 07/25/19 0906 (!) 140/97     Pulse Rate 07/25/19 0906 (!)  104     Resp 07/25/19 0906 16     Temp 07/25/19 0906 98.2 F (36.8 C)     Temp Source 07/25/19 0906 Oral     SpO2 07/25/19 0906 100 %     Weight --      Height --      Head Circumference --      Peak Flow --      Pain Score 07/25/19 0908 6     Pain Loc --      Pain Edu? --      Excl. in [place]? --    No data found.  Updated Vital Signs BP (!) 140/97 (BP Location: Left Arm)   Pulse (!) 104   Temp 98.2 F (36.8 C) (Oral)   Resp 16   SpO2 100%   Visual Acuity Right Eye Distance:   Left Eye Distance:   Bilateral Distance:    Right Eye Near:   Left Eye Near:    Bilateral Near:     Physical Exam Vitals and nursing note reviewed.  Constitutional:      Appearance: Normal appearance.  HENT:     Head: Normocephalic and atraumatic.     Nose: Nose normal.  Eyes:     Conjunctiva/sclera: Conjunctivae normal.  Pulmonary:  Effort: Pulmonary effort is normal.  Musculoskeletal:        General: Normal range of motion.     Cervical back: Normal range of motion.  Skin:    General: Skin is warm and dry.     Findings: Abscess and erythema present.          Comments: Approximate 4 cm x 4 center infected cyst to upper back area with surrounding erythema and induration. Centered fluctuance No drainage.   Neurological:     Mental Status: He is alert.  Psychiatric:        Mood and Affect: Mood normal.      UC Treatments / Results  Labs (all labs ordered are listed, but only abnormal results are displayed) Labs Reviewed - No data to display  EKG   Radiology No results found.  Procedures Incision and Drainage  Date/Time: 07/25/2019 1:36 PM Performed by: Janace Aris, NP Authorized by: Janace Aris, NP   Consent:    Consent obtained:  Verbal   Consent given by:  Patient   Risks discussed:  Bleeding, incomplete drainage, pain and damage to other organs   Alternatives discussed:  No treatment Universal protocol:    Patient identity confirmed:  Verbally with  patient Location:    Type:  Abscess   Size:  4   Location:  Trunk   Trunk location:  Back Pre-procedure details:    Skin preparation:  Betadine Anesthesia (see MAR for exact dosages):    Anesthesia method:  Local infiltration   Local anesthetic:  Lidocaine 2% w/o epi Procedure type:    Complexity:  Complex Procedure details:    Needle aspiration: no     Incision types:  Single straight   Incision depth:  Subcutaneous   Scalpel blade:  11   Wound management:  Probed and deloculated   Drainage:  Purulent and bloody   Drainage amount:  Copious   Wound treatment:  Wound left open   Packing materials:  1/4 in gauze Post-procedure details:    Patient tolerance of procedure:  Tolerated well, no immediate complications   (including critical care time)  Medications Ordered in UC Medications - No data to display  Initial Impression / Assessment and Plan / UC Course  I have reviewed the triage vital signs and the nursing notes.  Pertinent labs & imaging results that were available during my care of the patient were reviewed by me and considered in my medical decision making (see chart for details).     Infected cyst I&D here in clinic with large amount of purulent odorous discharge Packed the wound.  Cyst still needs to be removed.  Recommended follow-up with surgery for this. Placing on doxycycline for antibiotic coverage. Final Clinical Impressions(s) / UC Diagnoses   Final diagnoses:  Infected cyst of skin     Discharge Instructions     We were able to get the infection out and I am placing you on antibiotics. You need to follow-up with specialist to have the cyst removed completely. You can come back here for packing removal in a couple days unless you are able to get in with them You can take ibuprofen or Tylenol for pain as needed Follow up as needed for continued or worsening symptoms     ED Prescriptions    Medication Sig Dispense Auth. Provider    doxycycline (VIBRAMYCIN) 100 MG capsule Take 1 capsule (100 mg total) by mouth 2 (two) times daily for 7 days. 14 capsule  Janace Aris, NP     PDMP not reviewed this encounter.   Dahlia Byes A, NP 07/25/19 1337

## 2019-07-25 NOTE — Discharge Instructions (Addendum)
We were able to get the infection out and I am placing you on antibiotics. You need to follow-up with specialist to have the cyst removed completely. You can come back here for packing removal in a couple days unless you are able to get in with them You can take ibuprofen or Tylenol for pain as needed Follow up as needed for continued or worsening symptoms

## 2019-07-27 ENCOUNTER — Other Ambulatory Visit: Payer: Self-pay

## 2019-07-27 DIAGNOSIS — Z79899 Other long term (current) drug therapy: Secondary | ICD-10-CM | POA: Diagnosis not present

## 2019-07-27 DIAGNOSIS — Z4800 Encounter for change or removal of nonsurgical wound dressing: Secondary | ICD-10-CM | POA: Diagnosis not present

## 2019-07-27 DIAGNOSIS — I10 Essential (primary) hypertension: Secondary | ICD-10-CM | POA: Diagnosis not present

## 2019-07-27 DIAGNOSIS — Z7982 Long term (current) use of aspirin: Secondary | ICD-10-CM | POA: Diagnosis not present

## 2019-07-27 DIAGNOSIS — L02212 Cutaneous abscess of back [any part, except buttock]: Secondary | ICD-10-CM | POA: Diagnosis not present

## 2019-07-28 ENCOUNTER — Encounter (HOSPITAL_BASED_OUTPATIENT_CLINIC_OR_DEPARTMENT_OTHER): Payer: Self-pay | Admitting: Emergency Medicine

## 2019-07-28 ENCOUNTER — Emergency Department (HOSPITAL_BASED_OUTPATIENT_CLINIC_OR_DEPARTMENT_OTHER)
Admission: EM | Admit: 2019-07-28 | Discharge: 2019-07-28 | Disposition: A | Discharge status: Home / Self Care | Payer: BC Managed Care – PPO | Attending: Emergency Medicine | Admitting: Emergency Medicine

## 2019-07-28 DIAGNOSIS — Z5189 Encounter for other specified aftercare: Secondary | ICD-10-CM

## 2019-07-28 NOTE — ED Notes (Signed)
Back wound cleaned with NS and dressing applied.

## 2019-07-28 NOTE — ED Provider Notes (Addendum)
[name] Provider Note   CSN: [id number] Arrival date & time: 07/27/19  2357     History Chief Complaint  Patient presents with  . Wound Check    Zachary Gibbs is a 49 y.o. male.  The history is provided by the patient.  Wound Check This is a new problem. The current episode started more than 2 days ago. The problem occurs constantly. The problem has not changed since onset.Pertinent negatives include no chest pain, no abdominal pain, no headaches and no shortness of breath. Nothing aggravates the symptoms. Nothing relieves the symptoms. Treatments tried: antibiotics  The treatment provided moderate relief.  Patient seen for abscess of the upper back at urgent care on 5/31.  Packed.  Packing fell out tonight.  Is unsure if cyst cavity fell out.  Has not been able to get in touch with surgery for follow up.  No f/c/r.       Past Medical History:  Diagnosis Date  . Hypertension     There are no problems to display for this patient.   Past Surgical History:  Procedure Laterality Date  . HERNIA REPAIR         History reviewed. No pertinent family history.  Social History   Tobacco Use  . Smoking status: Never Smoker  . Smokeless tobacco: Never Used  Substance Use Topics  . Alcohol use: No  . Drug use: No    Home Medications Prior to Admission medications   Medication Sig Start Date End Date Taking? Authorizing Provider  amLODipine (NORVASC) 5 MG tablet Take 1 tablet (5 mg total) by mouth daily. 11/30/17   Jola Schmidt, MD  aspirin 81 MG chewable tablet Chew by mouth daily.    [provider]  doxycycline (VIBRAMYCIN) 100 MG capsule Take 1 capsule (100 mg total) by mouth 2 (two) times daily for 7 days. 07/25/19 08/01/19  Loura Halt A, NP  hydrochlorothiazide (HYDRODIURIL) 25 MG tablet Take 1 tablet (25 mg total) by mouth daily. 02/17/11 11/30/17  Verl Dicker, PA-C  triamterene-hydrochlorothiazide (MAXZIDE-25) 37.5-25 MG  tablet Take 1 tablet by mouth daily. 05/10/19   [provider]    Allergies    Patient has no known allergies.  Review of Systems   Review of Systems  Constitutional: Negative for fever.  HENT: Negative for congestion.   Eyes: Negative for visual disturbance.  Respiratory: Negative for shortness of breath.   Cardiovascular: Negative for chest pain.  Gastrointestinal: Negative for abdominal pain.  Genitourinary: Negative for difficulty urinating.  Musculoskeletal: Negative for arthralgias.  Skin: Positive for wound.  Neurological: Negative for headaches.  Psychiatric/Behavioral: Negative for agitation.  All other systems reviewed and are negative.   Physical Exam Updated Vital Signs BP (!) 140/100 (BP Location: Right Arm)   Pulse 88   Temp 98.5 F (36.9 C) (Oral)   Resp 18   SpO2 100%   Physical Exam Vitals and nursing note reviewed.  Constitutional:      General: He is not in acute distress.    Appearance: Normal appearance.  HENT:     Head: Normocephalic and atraumatic.     Nose: Nose normal.  Eyes:     Conjunctiva/sclera: Conjunctivae normal.     Pupils: Pupils are equal, round, and reactive to light.  Cardiovascular:     Rate and Rhythm: Normal rate and regular rhythm.     Pulses: Normal pulses.     Heart sounds: Normal heart sounds.  Pulmonary:  Effort: Pulmonary effort is normal.     Breath sounds: Normal breath sounds.  Abdominal:     General: Abdomen is flat. Bowel sounds are normal.     Palpations: Abdomen is soft.  Musculoskeletal:        General: Normal range of motion.     Cervical back: Normal range of motion and neck supple.     Comments: 8 mm circular opening in the skin.  No discharge.  Scant serosanguinous drainage.  Surrounding elevation consistent with early keloid.  No warmth, no erythema, no fluctuance.    Skin:    General: Skin is warm and dry.     Capillary Refill: Capillary refill takes less than 2 seconds.  Neurological:       General: No focal deficit present.     Mental Status: He is alert and oriented to person, place, and time.  Psychiatric:        Mood and Affect: Mood normal.        Behavior: Behavior normal.     ED Results / Procedures / Treatments   Labs (all labs ordered are listed, but only abnormal results are displayed) Labs Reviewed - No data to display  EKG None  Radiology No results found.  Procedures Procedures (including critical care time)  Medications Ordered in ED Medications - No data to display  ED Course  I have reviewed the triage vital signs and the nursing notes.  Pertinent labs & imaging results that were available during my care of the patient were reviewed by me and considered in my medical decision making (see chart for details).    Area cleansed with chlorhexidine, bacitracin applied.  Continue antibiotics, neosporin twice daily to wound and follow up with surgery as instructed.  Strict return precautions given.    Final Clinical Impression(s) / ED Diagnoses Final diagnoses:  Visit for wound check   Return for intractable cough, coughing up blood,fevers >100.4 unrelieved by medication, shortness of breath, intractable vomiting, chest pain, shortness of breath, weakness,numbness, changes in speech, facial asymmetry,abdominal pain, passing out,Inability to tolerate liquids or food, cough, altered mental status or any concerns. No signs of systemic illness or infection. The patient is nontoxic-appearing on exam and vital signs are within normal limits.   I have reviewed the triage vital signs and the nursing notes. Pertinent labs &imaging results that were available during my care of the patient were reviewed by me and considered in my medical decision making (see chart for details).After history, exam, and medical workup I feel the patient has beenappropriately medically screened and is safe for discharge home. Pertinent diagnoses were discussed with the  patient. Patient was given return precautions. Rx / DC Orders ED Discharge Orders    None       Deshae Dickison, MD 07/28/19 6606    Nicanor Alcon, Dasiah Hooley, MD 07/28/19 3016

## 2019-07-28 NOTE — ED Triage Notes (Signed)
Here for wound check on back of neck. Packing placed here 5/31.

## 2019-08-02 DIAGNOSIS — L723 Sebaceous cyst: Secondary | ICD-10-CM | POA: Diagnosis not present

## 2019-08-17 ENCOUNTER — Encounter: Payer: Self-pay | Admitting: Plastic Surgery

## 2019-08-17 ENCOUNTER — Ambulatory Visit: Payer: BC Managed Care – PPO | Admitting: Plastic Surgery

## 2019-08-17 ENCOUNTER — Other Ambulatory Visit: Payer: Self-pay

## 2019-08-17 VITALS — BP 156/116 | HR 102 | Temp 97.8°F | Ht 71.0 in | Wt 295.4 lb

## 2019-08-17 DIAGNOSIS — L723 Sebaceous cyst: Secondary | ICD-10-CM

## 2019-08-17 DIAGNOSIS — L910 Hypertrophic scar: Secondary | ICD-10-CM | POA: Diagnosis not present

## 2019-08-17 NOTE — Progress Notes (Signed)
   Referring Provider Andria Meuse, MD [address] [place],  [place] [address]   CC:  Chief Complaint  Patient presents with  . Advice Only      Zachary Gibbs is an 49 y.o. male.  HPI: Patient presents to discuss a cystic lesion in his right upper back.  Over the past few months he has had a swollen inflamed area there drained at the urgent care facility and packed for a while.  This appears to have healed up and there is no further wound but he does have the keloid adjacent to that area.  He seen a general surgeon who sent him here to have this addressed.  He currently does not have any pain from it but is apprehensive about it coming back and going to the same process again.  No Known Allergies  Outpatient Encounter Medications as of 08/17/2019  Medication Sig  . amLODipine (NORVASC) 5 MG tablet Take 1 tablet (5 mg total) by mouth daily.  Marland Kitchen aspirin 81 MG chewable tablet Chew by mouth daily.  Marland Kitchen triamterene-hydrochlorothiazide (MAXZIDE-25) 37.5-25 MG tablet Take 1 tablet by mouth daily.  . hydrochlorothiazide (HYDRODIURIL) 25 MG tablet Take 1 tablet (25 mg total) by mouth daily.   No facility-administered encounter medications on file as of 08/17/2019.     Past Medical History:  Diagnosis Date  . Hypertension     Past Surgical History:  Procedure Laterality Date  . HERNIA REPAIR      No family history on file.  Social History   Social History Narrative  . Not on file     Review of Systems General: Denies fevers, chills, weight loss CV: Denies chest pain, shortness of breath, palpitations  Physical Exam Vitals with BMI 08/17/2019 07/28/2019 07/25/2019  Height 5\' 11"  - -  Weight 295 lbs 6 oz - -  BMI 41.22 - -  Systolic 156 140  Diastolic 116 100 97  Pulse 102 88 104    General:  No acute distress,  Alert and oriented, Non-Toxic, Normal speech and affect Examination of the back shows a right upper back keloid as 2 to 3 cm in diameter.  Adjacent to  that is a subcutaneous cystic lesion at also 3 to 4 cm in diameter.  There is no fluctuance, erythema, or drainage.  Assessment/Plan Patient presents with a cystic lesion of the right upper back.  There is an adjacent keloid.  I explained that excising this would generate a fairly tight closure.  We discussed the possible need for adjacent tissue transfer to get the wound closed.  We did discuss that while this is not urgent and its possible that the cyst would become inflamed again he did have to go through the same process that he is just gone through.  We discussed the risks of excision that include bleeding, infection, damage to surrounding structures, need for additional procedures.  We discussed the potential for thick scar in that area that may keloid.  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  All of his questions were answered and we will plan to schedule this for the operating room due to the size.  102 08/17/2019, 10:09 AM

## 2019-08-30 DIAGNOSIS — Z125 Encounter for screening for malignant neoplasm of prostate: Secondary | ICD-10-CM | POA: Diagnosis not present

## 2019-08-30 DIAGNOSIS — Z0000 Encounter for general adult medical examination without abnormal findings: Secondary | ICD-10-CM | POA: Diagnosis not present

## 2019-08-30 DIAGNOSIS — Z131 Encounter for screening for diabetes mellitus: Secondary | ICD-10-CM | POA: Diagnosis not present

## 2019-08-30 DIAGNOSIS — I10 Essential (primary) hypertension: Secondary | ICD-10-CM | POA: Diagnosis not present

## 2019-08-30 DIAGNOSIS — Z13220 Encounter for screening for lipoid disorders: Secondary | ICD-10-CM | POA: Diagnosis not present

## 2019-09-05 DIAGNOSIS — I10 Essential (primary) hypertension: Secondary | ICD-10-CM | POA: Diagnosis not present

## 2019-09-05 DIAGNOSIS — Z0000 Encounter for general adult medical examination without abnormal findings: Secondary | ICD-10-CM | POA: Diagnosis not present

## 2019-09-05 DIAGNOSIS — Z13220 Encounter for screening for lipoid disorders: Secondary | ICD-10-CM | POA: Diagnosis not present

## 2019-09-05 DIAGNOSIS — Z125 Encounter for screening for malignant neoplasm of prostate: Secondary | ICD-10-CM | POA: Diagnosis not present

## 2019-09-05 DIAGNOSIS — E559 Vitamin D deficiency, unspecified: Secondary | ICD-10-CM | POA: Diagnosis not present

## 2019-09-14 ENCOUNTER — Telehealth: Payer: Self-pay | Admitting: Plastic Surgery

## 2019-09-14 NOTE — Telephone Encounter (Signed)
Left messages for return phone call on 7/1, 7/9, and 7/21 to attempt to schedule surgery with Dr. Arita Miss. Will send contact letter to the patient.

## 2019-10-18 DIAGNOSIS — Z20828 Contact with and (suspected) exposure to other viral communicable diseases: Secondary | ICD-10-CM | POA: Diagnosis not present

## 2019-10-19 ENCOUNTER — Ambulatory Visit: Payer: Self-pay

## 2019-10-21 ENCOUNTER — Ambulatory Visit
Admission: EM | Admit: 2019-10-21 | Discharge: 2019-10-21 | Disposition: A | Discharge status: Home / Self Care | Payer: BC Managed Care – PPO | Attending: Family Medicine | Admitting: Family Medicine

## 2019-10-21 DIAGNOSIS — U071 COVID-19: Secondary | ICD-10-CM | POA: Diagnosis not present

## 2019-10-21 DIAGNOSIS — E860 Dehydration: Secondary | ICD-10-CM

## 2019-10-21 DIAGNOSIS — R42 Dizziness and giddiness: Secondary | ICD-10-CM

## 2019-10-21 LAB — POCT URINALYSIS DIP (MANUAL ENTRY)
Bilirubin, UA: NEGATIVE
Blood, UA: NEGATIVE
Glucose, UA: NEGATIVE mg/dL
Ketones, POC UA: NEGATIVE mg/dL
Leukocytes, UA: NEGATIVE
Nitrite, UA: NEGATIVE
Protein Ur, POC: >=300 mg/dL — AB
Spec Grav, UA: >=1.03 — AB (ref 1.010–1.025)
Urobilinogen, UA: 0.2 E.U./dL
pH, UA: 5.5 (ref 5.0–8.0)

## 2019-10-21 LAB — POCT FASTING CBG KUC MANUAL ENTRY: POCT Glucose (KUC): 146 mg/dL — AB (ref 70–99)

## 2019-10-21 NOTE — ED Triage Notes (Signed)
Pt is COVID positive. Reports feeling like he is dehydrated. States last night he started feeling off balance and diaphoretic. States he has not been feeling stable on his feet. Pt reports feeling okay right now other than being tired. States he has not had much of an appetite and has not been eating and drinking like normal.

## 2019-10-21 NOTE — ED Provider Notes (Signed)
[facility]    CSN: [id number] Arrival date & time: 10/21/19  1157      History   Chief Complaint No chief complaint on file.   HPI Zachary Gibbs is a 49 y.o. male.   HPI Diagnosed with COVID-19 4 days ago at another facility.  Since that time patient has been feeling off balance and persistent dizzy.  His appetite has declined and he endorses not drinking fluids but continue to take his regular medications.  Patient is prescribed a diuretic for management of hypertension.  He denies any breathlessness, chest pain, difficulty moving air when , breathing.  He has a mild cough although nonproductive.  Has a mild low-grade fever on arrival today.  No nausea, vomiting or diarrhea. Past Medical History:  Diagnosis Date  . Hypertension     There are no problems to display for this patient.   Past Surgical History:  Procedure Laterality Date  . HERNIA REPAIR         Home Medications    Prior to Admission medications   Medication Sig Start Date End Date Taking? Authorizing Provider  amLODipine (NORVASC) 5 MG tablet Take 1 tablet (5 mg total) by mouth daily. 11/30/17   Azalia Bilis, MD  aspirin 81 MG chewable tablet Chew by mouth daily.    [provider]  hydrochlorothiazide (HYDRODIURIL) 25 MG tablet Take 1 tablet (25 mg total) by mouth daily. 02/17/11 11/30/17  Jaci Carrel, PA-C  triamterene-hydrochlorothiazide (MAXZIDE-25) 37.5-25 MG tablet Take 1 tablet by mouth daily. 05/10/19   [provider]    Family History No family history on file.  Social History Social History   Tobacco Use  . Smoking status: Never Smoker  . Smokeless tobacco: Never Used  Vaping Use  . Vaping Use: Never used  Substance Use Topics  . Alcohol use: No  . Drug use: No     Allergies   Patient has no known allergies.   Review of Systems Review of Systems Pertinent negatives listed in HPI  Physical Exam Triage Vital Signs ED Triage Vitals  Enc  Vitals Group     BP      Pulse      Resp      Temp      Temp src      SpO2      Weight      Height      Head Circumference      Peak Flow      Pain Score      Pain Loc      Pain Edu?      Excl. in [place]?    No data found.  Updated Vital Signs BP 120/86   Pulse (!) 107   Temp 99.4 F (37.4 C)   Resp 19   SpO2 95%   Visual Acuity Right Eye Distance:   Left Eye Distance:   Bilateral Distance:    Right Eye Near:   Left Eye Near:    Bilateral Near:     Physical Exam General appearance: alert, well developed, well nourished, cooperative and in no distress Head: Normocephalic, without obvious abnormality, atraumatic Respiratory: Respirations even and unlabored, normal respiratory rate Heart: rate and rhythm normal. No gallop or murmurs noted on exam  Abdomen: BS +, no distention, no rebound tenderness, or no mass Extremities: No gross deformities Skin: Skin color, texture, turgor normal. No rashes seen  Psych: Appropriate mood and affect. Neurologic: Mental status: Alert, oriented to person, place,  and time, thought content appropriate.   UC Treatments / Results  Labs (all labs ordered are listed, but only abnormal results are displayed) Labs Reviewed - No data to display  EKG   Radiology No results found.  Procedures Procedures (including critical care time)  Medications Ordered in UC Medications - No data to display  Initial Impression / Assessment and Plan / UC Course  I have reviewed the triage vital signs and the nursing notes.  Pertinent labs & imaging results that were available during my care of the patient were reviewed by me and considered in my medical decision making (see chart for details).    New COVID-19 diagnosis with dizziness likely related to dehydration secondary to poor intake and fever.Hold HCTZ  2 days and rehydrate, frequently check BP at home. If readings are persistently greater than 140/90, resume HCTZ before 2 days. Follow-up at  COVID clinic if symptoms persist. Continue quarantine until day 10. ER if RED flag symptoms develop. Final Clinical Impressions(s) / UC Diagnoses   Final diagnoses:  COVID-19 virus infection  Dizziness  Dehydration     Discharge Instructions     If symptoms worsen, go immediately to the emergency department.  Call the COVID clinic at [phone] to schedule an appointment if needed. The office is located in [place], near [place] and [employer].  Hold hydrochlorothiazide for at least 2 to 3 days to allow your body to rehydrate.  Continue to monitor your blood pressure.  If blood pressure readings are greater than 150/90 resume taking your hydrochlorothiazide.  Avoid intake of drinks high in caffeine and sugar as these can actually dehydrate your body especially with fever.  Drink water and sports drinks low in sugar content.  Continue to monitor your temperature any fever greater than 99 I would take Tylenol 650 mg every 6 hours as needed as mild fever in adults can attribute to dehydration.  If any symptoms of breathlessness or severe chest pressure develop go immediately to the emergency department.  Otherwise continue your quarantine per CDC recommendations of 10 days starting at the date of your formal diagnosis of COVID-19.    ED Prescriptions    None     PDMP not reviewed this encounter.   Bing Neighbors, FNP 10/28/19 0005

## 2019-10-21 NOTE — Discharge Instructions (Signed)
If symptoms worsen, go immediately to the emergency department.  Call the COVID clinic at [phone] to schedule an appointment if needed. The office is located in [place], near [place] and [employer].  Hold hydrochlorothiazide for at least 2 to 3 days to allow your body to rehydrate.  Continue to monitor your blood pressure.  If blood pressure readings are greater than 150/90 resume taking your hydrochlorothiazide.  Avoid intake of drinks high in caffeine and sugar as these can actually dehydrate your body especially with fever.  Drink water and sports drinks low in sugar content.  Continue to monitor your temperature any fever greater than 99 I would take Tylenol 650 mg every 6 hours as needed as mild fever in adults can attribute to dehydration.  If any symptoms of breathlessness or severe chest pressure develop go immediately to the emergency department.  Otherwise continue your quarantine per CDC recommendations of 10 days starting at the date of your formal diagnosis of COVID-19.

## 2019-11-01 ENCOUNTER — Ambulatory Visit: Payer: Self-pay

## 2021-01-02 ENCOUNTER — Other Ambulatory Visit: Payer: Self-pay

## 2021-01-02 ENCOUNTER — Encounter (HOSPITAL_BASED_OUTPATIENT_CLINIC_OR_DEPARTMENT_OTHER): Payer: Self-pay | Admitting: Obstetrics and Gynecology

## 2021-01-02 ENCOUNTER — Emergency Department (HOSPITAL_BASED_OUTPATIENT_CLINIC_OR_DEPARTMENT_OTHER)
Admission: EM | Admit: 2021-01-02 | Discharge: 2021-01-03 | Disposition: A | Discharge status: Home / Self Care | Payer: 59 | Attending: Emergency Medicine | Admitting: Emergency Medicine

## 2021-01-02 ENCOUNTER — Emergency Department (HOSPITAL_BASED_OUTPATIENT_CLINIC_OR_DEPARTMENT_OTHER): Payer: 59 | Admitting: Radiology

## 2021-01-02 DIAGNOSIS — S7001XA Contusion of right hip, initial encounter: Secondary | ICD-10-CM | POA: Insufficient documentation

## 2021-01-02 DIAGNOSIS — Y9389 Activity, other specified: Secondary | ICD-10-CM | POA: Diagnosis not present

## 2021-01-02 DIAGNOSIS — Z7982 Long term (current) use of aspirin: Secondary | ICD-10-CM | POA: Insufficient documentation

## 2021-01-02 DIAGNOSIS — I10 Essential (primary) hypertension: Secondary | ICD-10-CM | POA: Diagnosis not present

## 2021-01-02 DIAGNOSIS — Y92410 Unspecified street and highway as the place of occurrence of the external cause: Secondary | ICD-10-CM | POA: Diagnosis not present

## 2021-01-02 DIAGNOSIS — S300XXA Contusion of lower back and pelvis, initial encounter: Secondary | ICD-10-CM | POA: Diagnosis not present

## 2021-01-02 DIAGNOSIS — S61412A Laceration without foreign body of left hand, initial encounter: Secondary | ICD-10-CM | POA: Diagnosis present

## 2021-01-02 DIAGNOSIS — Z79899 Other long term (current) drug therapy: Secondary | ICD-10-CM | POA: Insufficient documentation

## 2021-01-02 DIAGNOSIS — S7002XA Contusion of left hip, initial encounter: Secondary | ICD-10-CM | POA: Diagnosis not present

## 2021-01-02 DIAGNOSIS — Z23 Encounter for immunization: Secondary | ICD-10-CM | POA: Insufficient documentation
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
## 2021-01-03 MED ORDER — HYDROCODONE-ACETAMINOPHEN 5-325 MG PO TABS
1.0000 | ORAL_TABLET | Freq: Once | ORAL | Status: AC
  Administered 2021-01-03: 1 via ORAL
  Filled 2021-01-03: qty 1

## 2021-01-03 MED ORDER — TETANUS-DIPHTH-ACELL PERTUSSIS 5-2.5-18.5 LF-MCG/0.5 IM SUSY
0.5000 mL | PREFILLED_SYRINGE | Freq: Once | INTRAMUSCULAR | Status: AC
  Administered 2021-01-03: 0.5 mL via INTRAMUSCULAR
  Filled 2021-01-03: qty 0.5

## 2021-01-03 MED ORDER — OXYCODONE HCL 5 MG PO TABS: 5.0000 mg | ORAL_TABLET | ORAL | 0 refills | Status: AC | PRN

## 2021-01-03 NOTE — Discharge Instructions (Addendum)
Apply ice for 30 minutes at a time, 4 times a day.  Take ibuprofen or naproxen as needed for pain.  For additional pain relief, add acetaminophen.  Please be aware that adding acetaminophen to either naproxen or ibuprofen gives you additional pain relief compared with taking either medication by itself.  Reserve oxycodone for severe pain that is not relieved with above-noted medications.

## 2021-01-03 NOTE — ED Notes (Signed)
Patient discharged home to self.  VS WDL.  All discharge instructions reviewed and patient demonstrated knowledge via teachback method.  Wheelchair out of ED.
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
## 2021-08-22 ENCOUNTER — Other Ambulatory Visit: Payer: Self-pay | Admitting: Internal Medicine

## 2021-08-23 LAB — CBC
HCT: 50.7 % — ABNORMAL HIGH (ref 38.5–50.0)
Hemoglobin: 16.3 g/dL (ref 13.2–17.1)
MCH: 25.7 pg — ABNORMAL LOW (ref 27.0–33.0)
MCHC: 32.1 g/dL (ref 32.0–36.0)
MCV: 79.8 fL — ABNORMAL LOW (ref 80.0–100.0)
MPV: 10.3 fL (ref 7.5–12.5)
Platelets: 239 10*3/uL (ref 140–400)
RBC: 6.35 10*6/uL — ABNORMAL HIGH (ref 4.20–5.80)
RDW: 16 % — ABNORMAL HIGH (ref 11.0–15.0)
WBC: 7.1 10*3/uL (ref 3.8–10.8)

## 2021-08-23 LAB — COMPLETE METABOLIC PANEL WITH GFR
AG Ratio: 1.7 (calc) (ref 1.0–2.5)
ALT: 22 U/L (ref 9–46)
AST: 16 U/L (ref 10–35)
Albumin: 4.6 g/dL (ref 3.6–5.1)
Alkaline phosphatase (APISO): 96 U/L (ref 35–144)
BUN: 15 mg/dL (ref 7–25)
CO2: 25 mmol/L (ref 20–32)
Calcium: 10.1 mg/dL (ref 8.6–10.3)
Chloride: 101 mmol/L (ref 98–110)
Creat: 1.18 mg/dL (ref 0.70–1.30)
Globulin: 2.7 g/dL (calc) (ref 1.9–3.7)
Glucose, Bld: 87 mg/dL (ref 65–99)
Potassium: 3.7 mmol/L (ref 3.5–5.3)
Sodium: 140 mmol/L (ref 135–146)
Total Bilirubin: 0.7 mg/dL (ref 0.2–1.2)
Total Protein: 7.3 g/dL (ref 6.1–8.1)
eGFR: 75 mL/min/{1.73_m2} (ref >=60)

## 2021-08-23 LAB — LIPID PANEL
Cholesterol: 209 mg/dL — ABNORMAL HIGH (ref <200)
HDL: 45 mg/dL (ref >=40)
LDL Cholesterol (Calc): 146 mg/dL (calc) — ABNORMAL HIGH
Non-HDL Cholesterol (Calc): 164 mg/dL (calc) — ABNORMAL HIGH (ref <130)
Total CHOL/HDL Ratio: 4.6 (calc) (ref <5.0)
Triglycerides: 80 mg/dL (ref <150)

## 2021-08-23 LAB — PSA: PSA: 0.73 ng/mL (ref <=4.00)

## 2021-08-23 LAB — VITAMIN D 25 HYDROXY (VIT D DEFICIENCY, FRACTURES): Vit D, 25-Hydroxy: 34 ng/mL (ref 30–100)

## 2021-08-23 LAB — TSH: TSH: 2.2 mIU/L (ref 0.40–4.50)

## 2022-02-07 ENCOUNTER — Other Ambulatory Visit: Payer: Self-pay | Admitting: Internal Medicine

## 2022-02-08 LAB — URINE CULTURE
MICRO NUMBER:: 14321589
Result:: NO GROWTH
SPECIMEN QUALITY:: ADEQUATE
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
